# Patient Record
Sex: Male | Born: 1985 | Hispanic: No | Marital: Single
Health system: Southern US, Community
[De-identification: ages and names within clinical notes are randomized; demographics above are authoritative.]

---

## 2014-01-17 ENCOUNTER — Ambulatory Visit
Admission: RE | Admit: 2014-01-17 | Discharge: 2014-01-17 | Disposition: A | Discharge status: Home / Self Care | Payer: No Typology Code available for payment source | Source: Ambulatory Visit | Attending: Infectious Disease | Admitting: Infectious Disease

## 2014-01-17 ENCOUNTER — Other Ambulatory Visit: Payer: Self-pay | Admitting: Infectious Disease

## 2014-01-17 DIAGNOSIS — R7611 Nonspecific reaction to tuberculin skin test without active tuberculosis: Secondary | ICD-10-CM

## 2014-11-12 IMAGING — CR DG CHEST 1V
1 series · 1 of 1 positions shown · non-contrast
Comparison: None.

CLINICAL DATA: Positive PPD.

EXAM:
CHEST - 1 VIEW

[view not recorded]
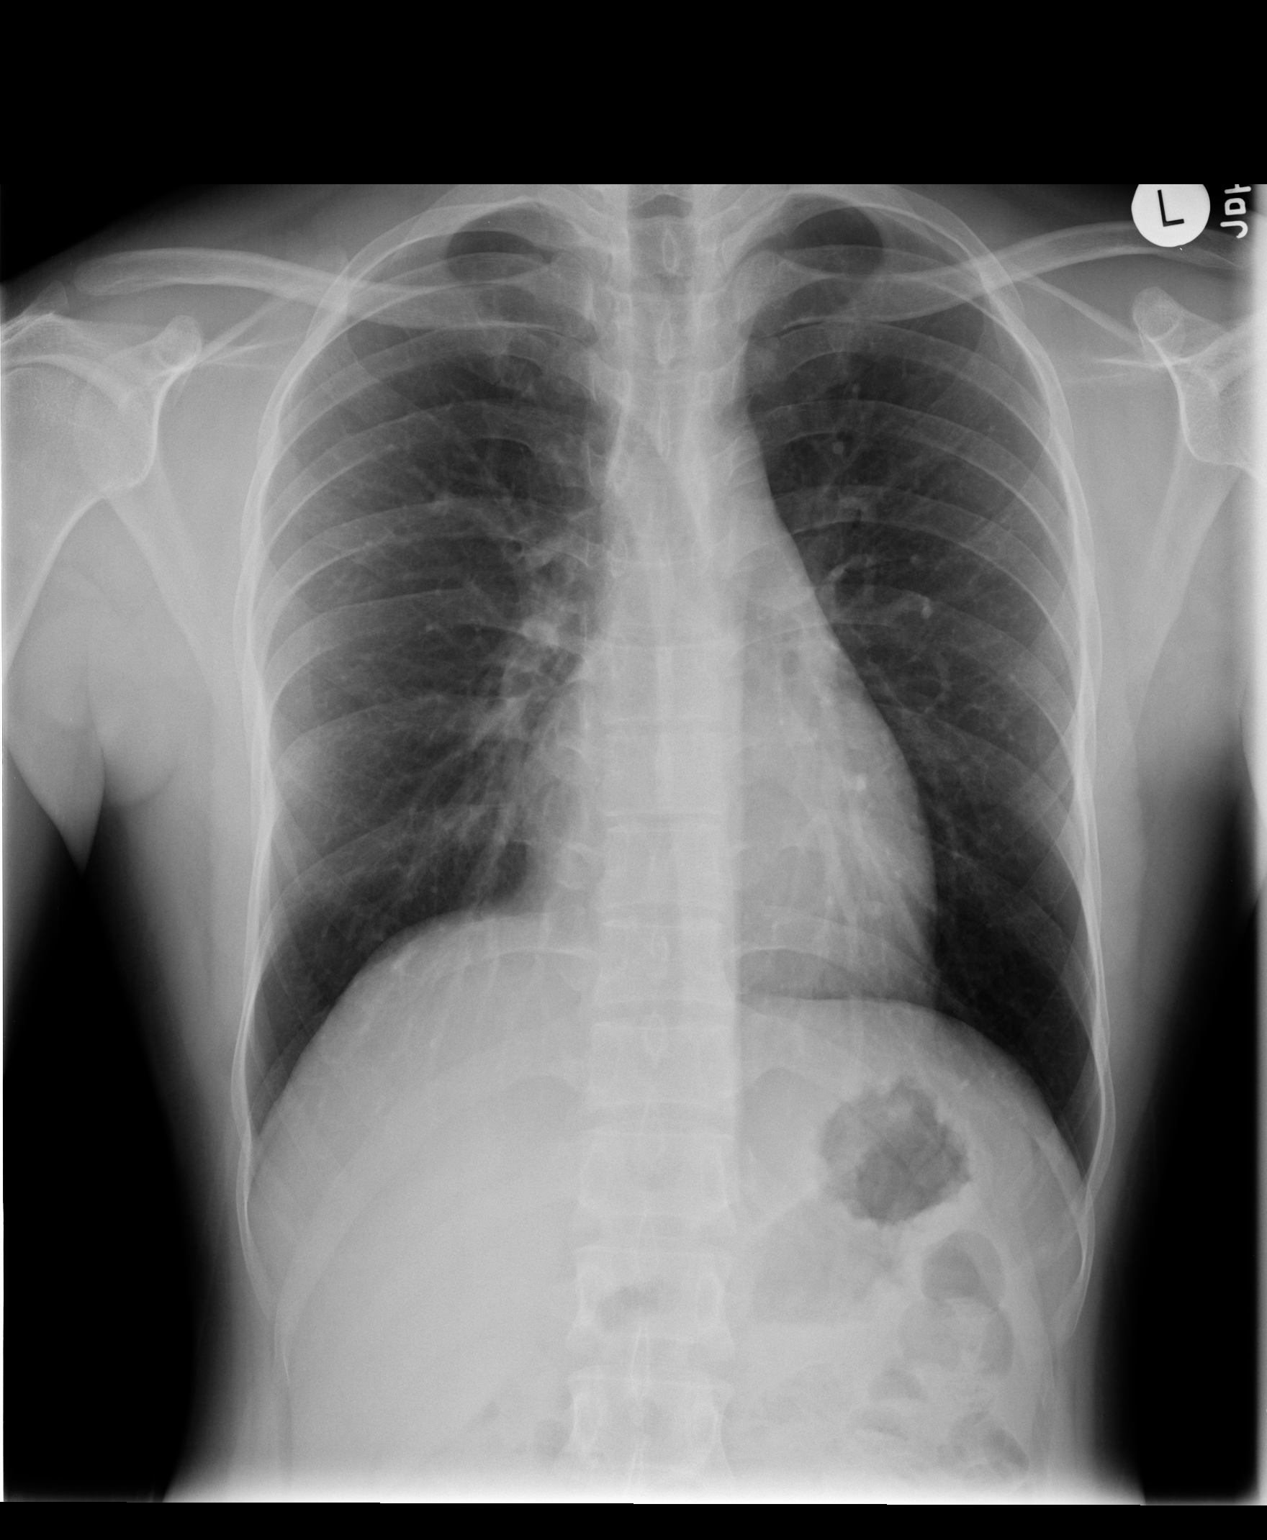

[1 of 1 positions shown; findings below may reference images not displayed]

FINDINGS: Mediastinum and hilar structures normal. Tiny calcific densities
noted in chest suggesting calcified granulomas. No pulmonary
infiltrate noted. No pleural effusion or pneumothorax.
IMPRESSION: 1. Small calcified granulomas.
2. No acute cardiopulmonary disease.
